# Patient Record
Sex: Male | Born: 1995 | Race: White | Hispanic: No | Marital: Single | Smoking: Never smoker
Health system: Southern US, Community
[De-identification: ages and names within clinical notes are randomized; demographics above are authoritative.]

## PROBLEM LIST (undated history)

## (undated) DIAGNOSIS — J45909 Unspecified asthma, uncomplicated: Secondary | ICD-10-CM

## (undated) DIAGNOSIS — F988 Other specified behavioral and emotional disorders with onset usually occurring in childhood and adolescence: Secondary | ICD-10-CM

## (undated) HISTORY — DX: Other specified behavioral and emotional disorders with onset usually occurring in childhood and adolescence: F98.8

## (undated) HISTORY — DX: Unspecified asthma, uncomplicated: J45.909

## (undated) HISTORY — PX: TONSILLECTOMY: SUR1361

## (undated) HISTORY — PX: SHOULDER SURGERY: SHX246

---
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
## 2021-12-08 DIAGNOSIS — J4520 Mild intermittent asthma, uncomplicated: Secondary | ICD-10-CM | POA: Insufficient documentation

## 2021-12-08 DIAGNOSIS — L709 Acne, unspecified: Secondary | ICD-10-CM | POA: Insufficient documentation

## 2021-12-08 NOTE — Patient Instructions (Signed)

## 2021-12-10 ENCOUNTER — Ambulatory Visit (INDEPENDENT_AMBULATORY_CARE_PROVIDER_SITE_OTHER): Payer: Self-pay | Admitting: Nurse Practitioner

## 2021-12-10 ENCOUNTER — Encounter: Payer: Self-pay | Admitting: Nurse Practitioner

## 2021-12-10 VITALS — BP 138/75 | HR 76 | Temp 98.2°F | Ht 69.0 in | Wt 190.1 lb

## 2021-12-10 DIAGNOSIS — J4520 Mild intermittent asthma, uncomplicated: Secondary | ICD-10-CM

## 2021-12-10 DIAGNOSIS — Z7689 Persons encountering health services in other specified circumstances: Secondary | ICD-10-CM

## 2021-12-10 DIAGNOSIS — H6993 Unspecified Eustachian tube disorder, bilateral: Secondary | ICD-10-CM | POA: Insufficient documentation

## 2021-12-10 DIAGNOSIS — H6983 Other specified disorders of Eustachian tube, bilateral: Secondary | ICD-10-CM

## 2021-12-10 DIAGNOSIS — Z8659 Personal history of other mental and behavioral disorders: Secondary | ICD-10-CM

## 2021-12-10 DIAGNOSIS — N509 Disorder of male genital organs, unspecified: Secondary | ICD-10-CM

## 2021-12-10 MED ORDER — PREDNISONE 20 MG PO TABS: 40.0000 mg | ORAL_TABLET | Freq: Every day | ORAL | 0 refills | Status: AC

## 2021-12-10 NOTE — Assessment & Plan Note (Addendum)
Acute for 2 weeks after being at beach.  Both ears appear to have eustachian tube dysfunction -- he has had some improvement with Zyrtec.  Recommend he continue this, as suspect related to allergies.  Sent in Prednisone 40 MG daily for 5 days.  Educated him on this and treatment.

## 2021-12-10 NOTE — Assessment & Plan Note (Signed)
No current medications, took Focalin in past (2010).

## 2021-12-10 NOTE — Progress Notes (Addendum)
New Patient Office Visit  Subjective    Patient ID: Gary Miles, male    DOB: Oct 14, 1995  Age: 26 y.o. MRN: [id number]  CC:  Chief Complaint  Patient presents with   New Patient (Initial Visit)    Patient is here as a new patient and to establish care. Patient denies having any concerns at today's visit.    Dizziness    Patient says he went to the beach within the past month, and when he got here he felt that he has inner ear. Patient says Wednesday, and he went an brought over the counter Meclizine. Patient says he does help when he took 1 in the morning and 1 at night. Patient says he feel Zyrtec helps him better.     HPI Gary Miles presents for new patient visit to establish care.  Introduced to Publishing rights manager role and practice setting.  All questions answered.  Discussed provider/patient relationship and expectations.  DIZZINESS Started about 2 weeks ago.  Returned from beach and then dizziness started, on and off -- noted more in the morning.  He bought some OTC Meclizine, which has been helping some.  Started back on Zyrtec, which seemed to help more then Meclizine.  Has history of childhood asthma - has albuterol inhaler at home, only uses when sick.   His brother and grandmother have vertigo.  Does have underlying intermittent allergy issues, has taken medication in past. 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   No facility-administered encounter medications on file as of 12/10/2021.    Past Medical History:  Diagnosis Date   ADD (attention deficit disorder)    Asthma     Past Surgical History:  Procedure Laterality Date   SHOULDER SURGERY Right    TONSILLECTOMY      Family History  Problem Relation Age of Onset   Hypertension Mother    Hypertension Father    Healthy Brother    Healthy Brother    Hypertension Paternal Grandmother    COPD Paternal Grandfather     Social History   Socioeconomic History   Marital status: Single    Spouse name: Not on file   Number of children: Not on file   Years of education: Not on file   Highest education level: Not on file  Occupational History   Not on file  Tobacco Use   Smoking status: Never   Smokeless tobacco: Never  Vaping Use   Vaping Use: Every day   Substances: Nicotine  Substance and Sexual Activity   Alcohol use: Yes    Comment: social beer use  Drug use: No   Sexual activity: Yes  Other Topics Concern   Not on file  Social History Narrative   Not on file   Social Determinants of Health   Financial Resource Strain: Low Risk  (12/10/2021)   Overall Financial Resource Strain (CARDIA)    Difficulty of Paying Living Expenses: Not hard at all  Food Insecurity: No Food Insecurity (12/10/2021)   Hunger Vital Sign    Worried About Running Out of Food in the Last Year: Never true    Ran Out of Food in the Last Year: Never true  Transportation Needs: No Transportation Needs (12/10/2021)    PRAPARE - Hydrologist (Medical): No    Lack of Transportation (Non-Medical): No  Physical Activity: Sufficiently Active (12/10/2021)   Exercise Vital Sign    Days of Exercise per Week: 5 days    Minutes of Exercise per Session: 30 min  Stress: No Stress Concern Present (12/10/2021)   [place]    Feeling of Stress : Not at all  Social Connections: Moderately Integrated (12/10/2021)   Social Connection and Isolation Panel [NHANES]    Frequency of Communication with Friends and Family: More than three times a week    Frequency of Social Gatherings with Friends and Family: More than three times a week    Attends Religious Services: 1 to 4 times per year    Active Member of [employer] or Organizations: No    Attends Archivist Meetings: Never    Marital Status: Living with partner  Intimate Partner Violence: Not At Risk (12/10/2021)   Humiliation, Afraid, Rape, and Kick questionnaire    Fear of Current or Ex-Partner: No    Emotionally Abused: No    Physically Abused: No    Sexually Abused: No   Review of Systems  Constitutional:  Negative for chills, diaphoresis, fever and weight loss.  HENT:  Negative for hearing loss and tinnitus.   Respiratory:  Negative for cough, shortness of breath and wheezing.   Cardiovascular:  Negative for chest pain, palpitations, orthopnea and leg swelling.  Neurological:  Positive for dizziness. Negative for tingling, tremors, focal weakness, seizures, loss of consciousness, weakness and headaches.  Endo/Heme/Allergies: Negative.   Psychiatric/Behavioral: Negative.        Objective    BP 138/75   Pulse 76   Temp 98.2 F (36.8 C) (Oral)   Ht 5\' 9"  (1.753 m)   Wt 190 lb 1.6 oz (86.2 kg)   SpO2 99%   BMI 28.07 kg/m   Physical Exam Vitals and nursing note reviewed.  Constitutional:      General: He is awake. He is not in acute distress.     Appearance: He is well-developed and well-groomed. He is not ill-appearing or toxic-appearing.  HENT:     Head: Normocephalic and atraumatic.     Right Ear: Hearing, ear canal and external ear normal. No drainage. A middle ear effusion is present.     Left Ear: Hearing, ear canal and external ear normal. No drainage. A middle ear effusion is present.     Nose: Rhinorrhea present. Rhinorrhea is clear.     Right Sinus: No maxillary sinus tenderness or frontal sinus tenderness.     Left Sinus: No maxillary sinus tenderness or frontal sinus tenderness.     Mouth/Throat:     Mouth: Mucous membranes are moist.     Pharynx: Posterior oropharyngeal erythema (mild with cobblestone  appearance) present. No pharyngeal swelling or oropharyngeal exudate.  Eyes:     General: Lids are normal.        Right eye: No discharge.        Left eye: No discharge.     Conjunctiva/sclera: Conjunctivae normal.     Pupils: Pupils are equal, round, and reactive to light.  Neck:     Thyroid: No thyromegaly.     Vascular: No carotid bruit or JVD.  Cardiovascular:     Rate and Rhythm: Normal rate and regular rhythm.     Heart sounds: Normal heart sounds, S1 normal and S2 normal. No murmur heard.    No gallop.  Pulmonary:     Effort: Pulmonary effort is normal. No accessory muscle usage or respiratory distress.     Breath sounds: Normal breath sounds.  Abdominal:     General: Bowel sounds are normal.     Palpations: Abdomen is soft.  Musculoskeletal:        General: Normal range of motion.     Cervical back: Normal range of motion and neck supple.     Right lower leg: No edema.     Left lower leg: No edema.  Lymphadenopathy:     Cervical: No cervical adenopathy.  Skin:    General: Skin is warm and dry.     Capillary Refill: Capillary refill takes less than 2 seconds.  Neurological:     Mental Status: He is alert and oriented to person, place, and time.     Cranial Nerves: Cranial nerves 2-12 are intact.      Motor: Motor function is intact.     Coordination: Coordination is intact.     Gait: Gait is intact.     Deep Tendon Reflexes: Reflexes are normal and symmetric.     Reflex Scores:      Brachioradialis reflexes are 2+ on the right side and 2+ on the left side.      Patellar reflexes are 2+ on the right side and 2+ on the left side. Psychiatric:        Mood and Affect: Mood normal.        Speech: Speech normal.        Behavior: Behavior normal. Behavior is cooperative.        Thought Content: Thought content normal.    Last CBC Lab Results  Component Value Date   WBC 11.2 (H) 01/26/2013   HGB 15.2 01/26/2013   HCT 45.5 01/26/2013   MCV 82 01/26/2013   MCH 27.3 01/26/2013   RDW 12.8 01/26/2013   PLT 206 01/26/2013      Assessment & Plan:   Problem List Items Addressed This Visit       Respiratory   Intermittent asthma    Chronic, stable with minimal Albuterol use.  Recommend he continue this as needed + focus on allergies -- Zyrtec daily.      Relevant Medications   predniSONE (DELTASONE) 20 MG tablet     Nervous and Auditory   Eustachian tube dysfunction, bilateral - Primary    Acute for 2 weeks after being at beach.  Both ears appear to have eustachian tube dysfunction -- he has had some improvement with Zyrtec.  Recommend he continue this, as suspect related to allergies.  Sent in Prednisone 40 MG daily for 5 days.  Educated him on this and treatment.          Other   History of attention deficit hyperactivity disorder (ADHD)  No current medications, took Focalin in past (2010).      Other Visit Diagnoses     Encounter to establish care       New to practice, introduced to setting and provider.        Return in about 6 weeks (around 01/21/2022) for Annual physical -- plus skin tag removal right side neck.   Marjie Skiff, NP

## 2021-12-10 NOTE — Assessment & Plan Note (Signed)
Chronic, stable with minimal Albuterol use.  Recommend he continue this as needed + focus on allergies -- Zyrtec daily.

## 2021-12-19 ENCOUNTER — Encounter: Payer: Self-pay | Admitting: Nurse Practitioner

## 2021-12-20 MED ORDER — PREDNISONE 10 MG PO TABS: ORAL_TABLET | ORAL | 0 refills | Status: DC

## 2022-01-21 ENCOUNTER — Encounter: Payer: Self-pay | Admitting: Nurse Practitioner

## 2022-01-21 ENCOUNTER — Ambulatory Visit: Payer: Self-pay | Admitting: *Deleted

## 2022-01-21 NOTE — Telephone Encounter (Signed)
  Chief Complaint: rash red possible poison ivy to face , eyelids and genitals per patients mother on DPR Symptoms: exposed to poison oak, ivy on 01/19/22, red rash and swelling to bilateral eyelids, face and genitals, severe itching. No oozing of fluid Frequency: rash started on Sunday 01/20/22 Pertinent Negatives: Patient denies difficulty breathing, no drainage from rash  Disposition: [] ED /[x] Urgent Care (no appt availability in office) / [] Appointment(In office/virtual)/ []  [facility]/ [] Home Care/ [x] Refused Recommended Disposition /[]  Mobile Bus/ []  Follow-up with PCP Additional Notes:   Patient's mother reports patient is able to work and does not want to go to [facility] or ED. Please advise if patient can be seen today or tomorrow.    Reason for Disposition  [1] Severe poison ivy, oak, or sumac reaction in the past AND [2] face or genitals involved  Answer Assessment - Initial Assessment Questions 1. APPEARANCE of RASH: "Describe the rash."      Red swelling 2. LOCATION: "Where is the rash located?"  (e.g., face, genitals, hands, legs)     Bilateral eyelids, face, genitals  3. SIZE: "How large is the rash?"      On eyelids  4. ONSET: "When did the rash begin?"      Sunday  5. ITCHING: "Does the rash itch?" If Yes, ask: "How bad is it?"   - MILD - doesn't interfere with normal activities   - MODERATE-SEVERE: interferes with work, school, sleep, or other activities      Moderate to severe 6. EXPOSURE:  "How were you exposed to the plant (poison ivy, poison oak, sumac)"  "When were you exposed?"       Saturday 01/19/22  7. PAST HISTORY: "Have you had a poison ivy rash before?" If Yes, ask: "How bad was it?"     Yes  8. PREGNANCY: "Is there any chance you are pregnant?" "When was your last menstrual period?"     na  Protocols used: [place] [facility]

## 2022-01-21 NOTE — Telephone Encounter (Signed)
Noted  

## 2022-01-21 NOTE — Telephone Encounter (Signed)
Mother called today asking about the poison ivy he has

## 2022-01-21 NOTE — Telephone Encounter (Signed)
Pt states that since he is scheduled for Thursday he will wait until then to be seen. I let him know that if he needs to be seen sooner to give [place] a call and we can definitely get him scheduled with Junie Panning

## 2022-01-21 NOTE — Telephone Encounter (Signed)
Called patient to schedule an appointment. He stated he would rather wait for his appointment on Thursday.

## 2022-01-22 ENCOUNTER — Ambulatory Visit
Admission: EM | Admit: 2022-01-22 | Discharge: 2022-01-22 | Disposition: A | Discharge status: Home / Self Care | Payer: Self-pay | Attending: Family Medicine | Admitting: Family Medicine

## 2022-01-22 DIAGNOSIS — L241 Irritant contact dermatitis due to oils and greases: Secondary | ICD-10-CM

## 2022-01-22 MED ORDER — TRIAMCINOLONE ACETONIDE 0.1 % EX OINT: 1.0000 | TOPICAL_OINTMENT | Freq: Two times a day (BID) | CUTANEOUS | 0 refills | Status: AC

## 2022-01-22 MED ORDER — PREDNISONE 10 MG PO TABS: ORAL_TABLET | ORAL | 0 refills | Status: DC

## 2022-01-22 MED ORDER — DEXAMETHASONE SODIUM PHOSPHATE 10 MG/ML IJ SOLN
10.0000 mg | Freq: Once | INTRAMUSCULAR | Status: AC
  Administered 2022-01-22: 10 mg via INTRAMUSCULAR

## 2022-01-22 NOTE — Discharge Instructions (Signed)

## 2022-01-22 NOTE — ED Triage Notes (Signed)
Pt c/o poison Ivy along Face, hands, and genitals x4days

## 2022-01-22 NOTE — ED Provider Notes (Signed)
[facility]    CSN: [id number] Arrival date & time: 01/22/22  0804      History   Chief Complaint Chief Complaint  Patient presents with   Poison Ivy    HPI Gary Miles is a 26 y.o. male.   HPI  Gary Miles presents for worsening rash.  Patient states he was putting up a deer stand this weekend and wrapped his arms around a tree.  He notes that the tree may have had a poison ivy or poison oak on it.  He has had something similar to this in the past.  He has a rash on his face, extremities, trunk and groin.  He put some over-the-counter medications that helped somewhat.  Notices in the morning that his eyes are, swollen.  Denies any blurry vision, fever, nausea, vomiting, diarrhea, chest pain, shortness of breath or palpitations.  There is been no joint aching.  He is having trouble sleeping at night due to itching.  He also took some Benadryl for this which helped him go to sleep.  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 Authorizing Provider  triamcinolone ointment (KENALOG) 0.1 % Apply 1 Application topically 2 (two) times daily. 01/22/22  Yes Halvor Behrend, Ronnette Juniper, DO  predniSONE (DELTASONE) 10 MG tablet Take 6 tablets by mouth daily for 2 days, then reduce by 1 tablet every 2 days until gone 01/22/22   Lyndee Hensen, DO    Family History Family History  Problem Relation Age of Onset   Hypertension Mother    Hypertension Father    Healthy  Brother    Healthy Brother    Hypertension Paternal Grandmother    COPD Paternal Grandfather     Social History Social History   Tobacco Use   Smoking status: Never   Smokeless tobacco: Never  Vaping Use   Vaping Use: Every day   Substances: Nicotine  Substance Use Topics   Alcohol use: Yes    Comment: social beer use   Drug use: No     Allergies   Patient has no known allergies.   Review of Systems Review of Systems :negative unless otherwise stated in HPI.      Physical Exam Triage Vital Signs ED Triage Vitals  Enc Vitals Group     BP 01/22/22 0818 (!) 141/104     Pulse Rate 01/22/22 0818 83     Resp 01/22/22 0818 16     Temp 01/22/22 0818 98.2 F (36.8 C)     Temp Source 01/22/22 0818 Oral     SpO2 01/22/22 0818 100 %     Weight 01/22/22 0817 185 lb (83.9 kg)     Height 01/22/22 0817 5\' 9"  (1.753 m)     Head Circumference --      Peak Flow --      Pain Score 01/22/22 0816 8     Pain Loc --      Pain  Edu? --      Excl. in GC? --    No data found.  Updated Vital Signs BP (!) 141/104 (BP Location: Left Arm)   Pulse 83   Temp 98.2 F (36.8 C) (Oral)   Resp 16   Ht 5\' 9"  (1.753 m)   Wt 83.9 kg   SpO2 100%   BMI 27.32 kg/m   Visual Acuity Right Eye Distance:   Left Eye Distance:   Bilateral Distance:    Right Eye Near:   Left Eye Near:    Bilateral Near:     Physical Exam  GEN: alert, well appearing male, in no acute distress   EYES: extra occular movements intact, no scleral injection  CV: regular rate  RESP: no increased work of breathing MSK: no extremity edema, no gross deformities NEURO: alert, moves all extremities appropriately, normal gait PSYCH: Normal affect, appropriate speech and behavior  SKIN: warm and dry; macular papular patches and streaks around the forehead, lip, bilateral upper extremities, groin not assessed as rash c/w contact dermatitis     UC Treatments / Results  Labs (all labs ordered are listed, but only  abnormal results are displayed) Labs Reviewed - No data to display  EKG   Radiology No results found.  Procedures Procedures (including critical care time)  Medications Ordered in UC Medications  dexamethasone (DECADRON) injection 10 mg (has no administration in time range)    Initial Impression / Assessment and Plan / UC Course  I have reviewed the triage vital signs and the nursing notes.  Pertinent labs & imaging results that were available during my care of the patient were reviewed by me and considered in my medical decision making (see chart for details).     Patient is a 26 y.o. malewho presents for worsening skin rash for the past 3 days.  Overall, patient is well-appearing and well-hydrated.  Vital signs stable.  [name] is afebrile.  Exam concerning for contact dermatitis likely due to poisonous plant oil.  Pt requesting Decadron injection. This is reasonable. Decadron 10 mg IM given. Rx with steroid taper to start tomorrow and steroid ointment, Benadryl and OTC analgesics to help with itching. No sign of infection to suggest antibiotics at this time. Doubt anaphylaxis.    Reviewed expectations regarding course of current medical issues.  All questions asked were answered.  Outlined signs and symptoms indicating need for more acute intervention. Patient verbalized understanding. After Visit Summary given.   Final Clinical Impressions(s) / UC Diagnoses   Final diagnoses:  Irritant contact dermatitis due to oils     Discharge Instructions      Stop by the pharmacy to pick up your prescriptions.  Follow up with your primary care provider as needed.   Go to ED for red flag symptoms, including; fevers you cannot reduce with Tylenol/Motrin, severe headaches, vision changes, numbness/weakness in part of the body, lethargy, confusion, intractable vomiting, severe dehydration, chest pain, breathing difficulty, severe persistent abdominal or pelvic pain, signs of severe  infection (increased redness, swelling of an area), feeling faint or passing out, dizziness, etc. You should especially go to the ED for sudden acute worsening of condition if you do not elect to go at this time.       ED Prescriptions     Medication Sig Dispense Auth. Provider   predniSONE (DELTASONE) 10 MG tablet Take 6 tablets by mouth daily for 2 days, then reduce by 1 tablet every 2 days until gone 42 tablet Tashi Andujo,  Traci Plemons, DO   triamcinolone ointment (KENALOG) 0.1 % Apply 1 Application topically 2 (two) times daily. 30 g Katha Cabal, DO      PDMP not reviewed this encounter.              Katha Cabal, DO 01/22/22 [phone]

## 2022-01-24 ENCOUNTER — Encounter: Payer: Self-pay | Admitting: Nurse Practitioner

## 2022-01-24 DIAGNOSIS — Z13220 Encounter for screening for lipoid disorders: Secondary | ICD-10-CM

## 2022-01-24 DIAGNOSIS — Z1159 Encounter for screening for other viral diseases: Secondary | ICD-10-CM

## 2022-01-24 DIAGNOSIS — J4520 Mild intermittent asthma, uncomplicated: Secondary | ICD-10-CM

## 2022-01-24 DIAGNOSIS — Z114 Encounter for screening for human immunodeficiency virus [HIV]: Secondary | ICD-10-CM

## 2022-01-24 DIAGNOSIS — Z0000 Encounter for general adult medical examination without abnormal findings: Secondary | ICD-10-CM

## 2022-10-09 ENCOUNTER — Encounter: Payer: Self-pay | Admitting: Nurse Practitioner

## 2023-06-19 ENCOUNTER — Ambulatory Visit: Payer: Self-pay | Admitting: Nurse Practitioner
